# Patient Record
Sex: Male | Born: 1990 | Race: Black or African American | Hispanic: No | Marital: Single | Smoking: Current some day smoker
Health system: Southern US, Community
[De-identification: ages and names within clinical notes are randomized; demographics above are authoritative.]

## PROBLEM LIST (undated history)

## (undated) DIAGNOSIS — F329 Major depressive disorder, single episode, unspecified: Secondary | ICD-10-CM

## (undated) DIAGNOSIS — J45909 Unspecified asthma, uncomplicated: Secondary | ICD-10-CM

## (undated) DIAGNOSIS — F32A Depression, unspecified: Secondary | ICD-10-CM

## (undated) DIAGNOSIS — F419 Anxiety disorder, unspecified: Secondary | ICD-10-CM

## (undated) HISTORY — DX: Anxiety disorder, unspecified: F41.9

## (undated) HISTORY — DX: Depression, unspecified: F32.A

## (undated) HISTORY — DX: Unspecified asthma, uncomplicated: J45.909

## (undated) HISTORY — DX: Major depressive disorder, single episode, unspecified: F32.9

---
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
## 2014-10-03 ENCOUNTER — Ambulatory Visit (INDEPENDENT_AMBULATORY_CARE_PROVIDER_SITE_OTHER): Payer: BLUE CROSS/BLUE SHIELD

## 2014-10-03 ENCOUNTER — Ambulatory Visit (INDEPENDENT_AMBULATORY_CARE_PROVIDER_SITE_OTHER): Payer: BLUE CROSS/BLUE SHIELD | Admitting: Family Medicine

## 2014-10-03 VITALS — BP 110/72 | HR 70 | Temp 98.1°F | Resp 16 | Ht 71.5 in | Wt 184.2 lb

## 2014-10-03 DIAGNOSIS — Z0000 Encounter for general adult medical examination without abnormal findings: Secondary | ICD-10-CM

## 2014-10-03 DIAGNOSIS — R9431 Abnormal electrocardiogram [ECG] [EKG]: Secondary | ICD-10-CM | POA: Diagnosis not present

## 2014-10-03 DIAGNOSIS — M20031 Swan-neck deformity of right finger(s): Secondary | ICD-10-CM

## 2014-10-03 DIAGNOSIS — R079 Chest pain, unspecified: Secondary | ICD-10-CM

## 2014-10-03 LAB — POCT CBC
Granulocyte percent: 48.3 %G (ref 37–80)
HCT, POC: 43 % — AB (ref 43.5–53.7)
Hemoglobin: 13.9 g/dL — AB (ref 14.1–18.1)
Lymph, poc: 2.2 (ref 0.6–3.4)
MCH, POC: 28.9 pg (ref 27–31.2)
MCHC: 32.4 g/dL (ref 31.8–35.4)
MCV: 89 fL (ref 80–97)
MID (cbc): 0.5 (ref 0–0.9)
MPV: 6.4 fL (ref 0–99.8)
POC Granulocyte: 2.5 (ref 2–6.9)
POC LYMPH PERCENT: 42.4 %L (ref 10–50)
POC MID %: 9.3 %M (ref 0–12)
Platelet Count, POC: 256 10*3/uL (ref 142–424)
RBC: 4.83 M/uL (ref 4.69–6.13)
RDW, POC: 13 %
WBC: 5.2 10*3/uL (ref 4.6–10.2)

## 2014-10-03 LAB — POCT URINALYSIS DIPSTICK
Bilirubin, UA: NEGATIVE
Blood, UA: NEGATIVE
Glucose, UA: NEGATIVE
Ketones, UA: NEGATIVE
Leukocytes, UA: NEGATIVE
Nitrite, UA: NEGATIVE
Protein, UA: NEGATIVE
Spec Grav, UA: 1.015
Urobilinogen, UA: 0.2
pH, UA: 7.5

## 2014-10-03 LAB — LIPID PANEL
Cholesterol: 128 mg/dL (ref 0–200)
HDL: 48 mg/dL (ref >=40)
LDL Cholesterol: 73 mg/dL (ref 0–99)
Total CHOL/HDL Ratio: 2.7 Ratio
Triglycerides: 34 mg/dL (ref <150)
VLDL: 7 mg/dL (ref 0–40)

## 2014-10-03 LAB — COMPLETE METABOLIC PANEL WITH GFR
ALT: 21 U/L (ref 0–53)
AST: 27 U/L (ref 0–37)
Albumin: 4.1 g/dL (ref 3.5–5.2)
Alkaline Phosphatase: 74 U/L (ref 39–117)
BUN: 9 mg/dL (ref 6–23)
CO2: 25 mEq/L (ref 19–32)
Calcium: 9.3 mg/dL (ref 8.4–10.5)
Chloride: 105 mEq/L (ref 96–112)
Creat: 0.96 mg/dL (ref 0.50–1.35)
GFR, Est African American: >89 mL/min
GFR, Est Non African American: >89 mL/min
Glucose, Bld: 83 mg/dL (ref 70–99)
Potassium: 4.2 mEq/L (ref 3.5–5.3)
Sodium: 137 mEq/L (ref 135–145)
Total Bilirubin: 0.7 mg/dL (ref 0.2–1.2)
Total Protein: 7.1 g/dL (ref 6.0–8.3)

## 2014-10-03 NOTE — Patient Instructions (Signed)
[employer], [place], [place]  (see "Waiting for Superman" movie) [place], [place] [address] [phone] [email or web address]   Health Maintenance - 10-9 Years [place] After high school, you may attend college or technical or vocational school, enroll in the [employer], or enter the workforce. PHYSICAL, SOCIAL, AND EMOTIONAL DEVELOPMENT  One hour of regular physical activity daily is recommended. Continue to participate in sports.  Develop your own interests and consider community service or volunteerism.  Make decisions about college and work plans.  Throughout these years, you should assume responsibility for your own health care. Increasing independence is important for you.  You may be exploring your sexual identity. Understand that you should never be in a situation that makes you feel uncomfortable, and tell your partner if you do not want to engage in sexual activity.  Body image may become important to you. Be mindful that eating disorders can develop at this time. Talk to your parents or other caregivers if you have concerns about body image, weight gain, or losing weight.  You may notice mood disturbances, depression, anxiety, attention problems, or trouble with alcohol. Talk to your health care provider if you have concerns about mental illness.  Set limits for yourself and talk with your parents or other caregivers about independent decision making.  Handle conflict without physical violence.  Avoid loud noises which may impair hearing.  Limit television and computer time to 2 hours each day. Individuals who engage in excessive inactivity are more likely to become overweight. RECOMMENDED IMMUNIZATIONS  Influenza vaccine.  All adults should be immunized every year.  All adults, including pregnant women and people with hives-only allergy to eggs, can receive the inactivated influenza (IIV) vaccine.  Adults aged 18-49 years can receive the  recombinant influenza (RIV) vaccine. The RIV vaccine does not contain any egg protein.  Tetanus, diphtheria, and acellular pertussis (Td, Tdap) vaccine.  Pregnant women should receive 1 dose of Tdap vaccine during each pregnancy. The dose should be obtained regardless of the length of time since the last dose. Immunization is preferred during the 27th to 36th week of gestation.  An adult who has not previously received Tdap or who does not know his or her vaccine status should receive 1 dose of Tdap. This initial dose should be followed by tetanus and diphtheria toxoids (Td) booster doses every 10 years.  Adults with an unknown or incomplete history of completing a 3-dose immunization series with Td-containing vaccines should begin or complete a primary immunization series including a Tdap dose.  Adults should receive a Td booster every 10 years.  Varicella vaccine.  An adult without evidence of immunity to varicella should receive 2 doses or a second dose if he or she has previously received 1 dose.  Pregnant females who do not have evidence of immunity should receive the first dose after pregnancy. This first dose should be obtained before leaving the health care facility. The second dose should be obtained 4-8 weeks after the first dose.  Human papillomavirus (HPV) vaccine.  Females aged 13-26 years who have not received the vaccine previously should obtain the 3-dose series.  The vaccine is not recommended for pregnant females. However, pregnancy testing is not needed before receiving a dose. If a male is found to be pregnant after receiving a dose, no treatment is needed. In that case, the remaining doses should be delayed until after the pregnancy.  Males aged 42-21 years who have not received the vaccine previously should receive  the 3-dose series. Males aged 22-26 years may be immunized.  Immunization is recommended through the age of 65 years for any male who has sex with males  and did not get any or all doses earlier.  Immunization is recommended for any person with an immunocompromised condition through the age of 66 years if he or she did not get any or all doses earlier.  During the 3-dose series, the second dose should be obtained 4-8 weeks after the first dose. The third dose should be obtained 24 weeks after the first dose and 16 weeks after the second dose.  Measles, mumps, and rubella (MMR) vaccine.  Adults born in [birth year] or later should have 1 or more doses of MMR vaccine unless there is a contraindication to the vaccine or there is laboratory evidence of immunity to each of the three diseases.  A routine second dose of MMR vaccine should be obtained at least 28 days after the first dose for students attending postsecondary schools, health care workers, and international travelers.  For females of childbearing age, rubella immunity should be determined. If there is no evidence of immunity, females who are not pregnant should be vaccinated. If there is no evidence of immunity, females who are pregnant should delay immunization until after pregnancy.  Pneumococcal 13-valent conjugate (PCV13) vaccine.  When indicated, a person who is uncertain of his or her immunization history and has no record of immunization should receive the PCV13 vaccine.  An adult aged 4 years or older who has certain medical conditions and has not been previously immunized should receive 1 dose of PCV13 vaccine. This PCV13 should be followed with a dose of pneumococcal polysaccharide (PPSV23) vaccine. The PPSV23 vaccine dose should be obtained at least 8 weeks after the dose of PCV13 vaccine.  An adult aged 26 years or older who has certain medical conditions and previously received 1 or more doses of PPSV23 vaccine should receive 1 dose of PCV13. The PCV13 vaccine dose should be obtained 1 or more years after the last PPSV23 vaccine dose.  Pneumococcal polysaccharide (PPSV23)  vaccine.  When PCV13 is also indicated, PCV13 should be obtained first.  An adult younger than age 53 years who has certain medical conditions should be immunized.  Any person who resides in a long-term care facility should be immunized.  An adult smoker should be immunized.  People with an immunocompromised condition and certain other conditions should receive both PCV13 and PPSV23 vaccines.  People with human immunodeficiency virus (HIV) infection should be immunized as soon as possible after diagnosis.  Immunization during chemotherapy or radiation therapy should be avoided.  Routine use of PPSV23 vaccine is not recommended for American Indians, [place] Natives, or people younger than 65 years unless there are medical conditions that require PPSV23 vaccine.  When indicated, people who have unknown immunization and have no record of immunization should receive PPSV23 vaccine.  One-time revaccination 5 years after the first dose of PPSV23 is recommended for people aged 19-64 years who have chronic kidney failure, nephrotic syndrome, asplenia, or immunocompromised conditions.  Meningococcal vaccine.  Adults with asplenia or persistent complement component deficiencies should receive 2 doses of quadrivalent meningococcal conjugate (MenACWY-D) vaccine. The doses should be obtained at least 2 months apart.  Microbiologists working with certain meningococcal bacteria, [place] recruits, people at risk during an outbreak, and people who travel to or live in countries with a high rate of meningitis should be immunized.  A first-year college student up through age 71  years who is living in a residence hall should receive a dose if he or she did not receive a dose on or after his or her 63th birthday.  Adults who have certain high-risk conditions should receive one or more doses of vaccine.  Hepatitis A vaccine.  Adults who wish to be protected from this disease, have certain high-risk  conditions, work with hepatitis A-infected animals, work in hepatitis A research labs, or travel to or work in countries with a high rate of hepatitis A should be immunized.  Adults who were previously unvaccinated and who anticipate close contact with an international adoptee during the first 60 days after arrival in the [place] from a country with a high rate of hepatitis A should be immunized.  Hepatitis B vaccine.  Adults who wish to be protected from this disease, have certain high-risk conditions, may be exposed to blood or other infectious body fluids, are household contacts or sex partners of hepatitis B positive people, are clients or workers in certain care facilities, or travel to or work in countries with a high rate of hepatitis B should be immunized.  Haemophilus influenzae type b (Hib) vaccine.  A previously unvaccinated person with asplenia or sickle cell disease or having a scheduled splenectomy should receive 1 dose of Hib vaccine.  Regardless of previous immunization, a recipient of a hematopoietic stem cell transplant should receive a 3-dose series 6-12 months after his or her successful transplant.  Hib vaccine is not recommended for adults with HIV infection. TESTING  Annual screening for vision and hearing problems is recommended. Vision should be screened at least once between 81-37 years of age.  You may be screened for anemia or tuberculosis.  You should have a blood test to check for high cholesterol.  You should be screened for alcohol and drug use.  If you are sexually active, you may be screened for sexually transmitted infections (STIs), pregnancy, or HIV. You should be screened for STIs if:  Your sexual activity has changed since the last screening test, and you are at an increased risk for chlamydia or gonorrhea. Ask your health care provider if you are at risk.  If you are at an increased risk for hepatitis B, you should be screened for this virus.  You are considered at high risk for hepatitis B if you:  Were born in a country where hepatitis B occurs often. Talk with your health care provider about which countries are considered high risk.  Have parents who were born in a high-risk country and have not received a shot to protect against hepatitis B (hepatitis B vaccine).  Have HIV or AIDS.  Use needles to inject street drugs.  Live with or have sex with someone who has hepatitis B.  Are a man who has sex with other men (MSM).  Get hemodialysis treatment.  Take certain medicines for conditions like cancer, organ transplantation, or autoimmune conditions. NUTRITION   You should:  Have three servings of low-fat milk and dairy products daily. If you do not drink milk or consume dairy products, you should eat calcium-enriched foods, such as juice, bread, or cereal. Dark, leafy greens or canned fish are alternate sources of calcium.  Drink plenty of water. Fruit juice should be limited to 8-12 oz (240-360 mL) each day. Sugary beverages and sodas should be avoided.  Avoid eating foods high in fat, salt, or sugar, such as chips, candy, and cookies.  Avoid fast foods and limit eating out at restaurants.  Try not to skip meals, especially breakfast. You should eat a variety of vegetables, fruits, and lean meats.  Eat meals together as a family whenever possible. ORAL HEALTH Brush your teeth twice a day and floss at least once a day. You should have two dental exams a year.  SKIN CARE You should wear sunscreen when out in the sun. TALK TO SOMEONE ABOUT:  Precautions against pregnancy, contraception, and sexually transmitted infections.  Taking a prescription medicine daily to prevent HIV infection if you are at risk of being infected with HIV. This is called preexposure prophylaxis (PrEP). You are at risk if you:  Are a male who has sex with other males (MSM).  Are heterosexual and sexually active with more than one  partner.  Take drugs by injection.  Are sexually active with a partner who has HIV.  Whether you are at high risk of being infected with HIV. If you choose to begin PrEP, you should first be tested for HIV. You should then be tested every 3 months for as long as you are taking PrEP.  Drug, tobacco, and alcohol use among your friends or at friends' homes. Smoking tobacco or marijuana and taking drugs have health consequences and may impact your brain development.  Appropriate use of over-the-counter or prescription medicines.  Driving guidelines and riding with friends.  The risks of drinking and driving or boating. Call someone if you have been drinking or using drugs and need a ride. WHAT'S NEXT? Visit your pediatrician or family physician once a year. By young adulthood, you should transition from your pediatrician to a family physician or internal medicine specialist. If you are a male and are sexually active, you may want to begin annual physical exams with a gynecologist. Document Released: 07/21/2006 Document Revised: 04/30/2013 Document Reviewed: 08/10/2006 [facility] Patient Information 2015 [place], [place]. This information is not intended to replace advice given to you by your health care provider. Make sure you discuss any questions you have with your health care provider.

## 2014-10-03 NOTE — Progress Notes (Signed)
Subjective:  This chart was scribed for Elvina Sidle MD, by Veverly Fells, at [facility].  This patient was seen in room 12 and the patient's care was started at 1:40 PM.   Chief Complaint  Patient presents with   Annual Exam    like to discuss acid reflux, and chest pain     Patient ID: Raymond Moyer, male    DOB: [birth year], 25 y.o.   MRN: [id number]  HPI  HPI Comments: Raymond Moyer is a 24 y.o. male who presents to the [facility] complaining of chest pain intermittently a while ago/ is here for a check up.  He states that when he had chest pain a while ago, he was not eating well and thinks it may have happened to due his diet. Patient received an EKG while in college and was told that he had an "abnormal" EKG which was common among black african americans so they told him it was normal in his case. He also complains of jamming his right injdex finger playing basketball three days ago and states that his PIP joint feels swollen and tender. Patient finished his degree for exercise sports science and is now looking for an internship with the [employer]/ other jobs.  He was recently checked for STD's and found to be negative. He has no other complaints today.   Past Medical History  Diagnosis Date   Anxiety    Asthma    Depression     No current outpatient prescriptions on file prior to visit.   No current facility-administered medications on file prior to visit.    No Known Allergies   Review of Systems     Objective:   Physical Exam  Constitutional: He is oriented to person, place, and time. He appears well-developed and well-nourished. No distress.  HENT:  Head: Normocephalic and atraumatic.  Eyes: Conjunctivae and EOM are normal. Pupils are equal, round, and reactive to light.  Cardiovascular: Normal rate.   Pulmonary/Chest: Effort normal. No respiratory distress. He has no wheezes. He has no rales.    Abdominal: Soft. There is no tenderness. There is no rebound and no guarding.  Musculoskeletal:  The right PIP joint is swollen and tender.   Neurological: He is alert and oriented to person, place, and time.  Skin: Skin is warm and dry.  Psychiatric: He has a normal mood and affect. His behavior is normal.  Nursing note and vitals reviewed.  Filed Vitals:   10/03/14 1319  BP: 110/72  Pulse: 70  Temp: 98.1 F (36.7 C)  TempSrc: Oral  Resp: 16  Height: 5' 11.5" (1.816 m)  Weight: 184 lb 3.2 oz (83.553 kg)  SpO2: 99%   [facility] reading (PRIMARY) by  Dr. Milus Glazier:  No fracture.  Results for orders placed or performed in visit on 10/03/14  POCT CBC  Result Value Ref Range   WBC 5.2 4.6 - 10.2 K/uL   Lymph, poc 2.2 0.6 - 3.4   POC LYMPH PERCENT 42.4 10 - 50 %L   MID (cbc) 0.5 0 - 0.9   POC MID % 9.3 0 - 12 %M   POC Granulocyte 2.5 2 - 6.9   Granulocyte percent 48.3 37 - 80 %G   RBC 4.83 4.69 - 6.13 M/uL   Hemoglobin 13.9 (A) 14.1 - 18.1 g/dL   HCT, POC 28.4 (A) 13.2 - 53.7 %   MCV 89.0 80 - 97 fL   MCH, POC 28.9  27 - 31.2 pg   MCHC 32.4 31.8 - 35.4 g/dL   RDW, POC 28.413.0 %   Platelet Count, POC 256 142 - 424 K/uL   MPV 6.4 0 - 99.8 fL  POCT urinalysis dipstick  Result Value Ref Range   Color, UA yellow    Clarity, UA clear    Glucose, UA neg    Bilirubin, UA neg    Ketones, UA neg    Spec Grav, UA 1.015    Blood, UA neg    pH, UA 7.5    Protein, UA neg    Urobilinogen, UA 0.2    Nitrite, UA neg    Leukocytes, UA Negative        Assessment & Plan:    This chart was scribed in my presence and reviewed by me personally.    ICD-9-CM ICD-10-CM   1. Annual physical exam V70.0 Z00.00 POCT CBC     POCT urinalysis dipstick     COMPLETE METABOLIC PANEL WITH GFR     Lipid panel  2. Nonspecific abnormal electrocardiogram (ECG) (EKG) 794.31 R94.31 EKG 12-Lead     EKG 12-Lead  3. 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

## 2015-08-05 ENCOUNTER — Ambulatory Visit (INDEPENDENT_AMBULATORY_CARE_PROVIDER_SITE_OTHER): Payer: Self-pay

## 2015-08-05 ENCOUNTER — Ambulatory Visit (INDEPENDENT_AMBULATORY_CARE_PROVIDER_SITE_OTHER): Payer: Self-pay | Admitting: Family Medicine

## 2015-08-05 VITALS — BP 130/82 | HR 68 | Temp 97.7°F | Resp 18 | Ht 72.37 in | Wt 188.0 lb

## 2015-08-05 DIAGNOSIS — S8991XA Unspecified injury of right lower leg, initial encounter: Secondary | ICD-10-CM

## 2015-08-05 MED ORDER — IBUPROFEN 200 MG PO TABS
200.0000 mg | ORAL_TABLET | Freq: Once | ORAL | Status: AC
  Administered 2015-08-05: 200 mg via ORAL

## 2015-08-05 NOTE — Patient Instructions (Addendum)
Ibuprofen 2-3 tablets every 8 hours for pain and swelling Elevate when possible Ice for 20 minutes off and on today Gentle range of motion several times a day Leave steri strips on until they fall off    IF you received an x-ray today, you will receive an invoice from [facility]. Please contact [facility] at [phone] with questions or concerns regarding your invoice.   IF you received labwork today, you will receive an invoice from [facility]. Please contact [facility] at [phone] with questions or concerns regarding your invoice.   Our billing staff will not be able to assist you with questions regarding bills from these companies.  You will be contacted with the lab results as soon as they are available. The fastest way to get your results is to activate your My Chart account. Instructions are located on the last page of this paperwork. If you have not heard from us regarding the results in 2 weeks, please contact this office.

## 2015-08-05 NOTE — Progress Notes (Signed)
   Subjective:    Patient ID: Raymond Moyer, male    DOB: [birth year], 25 y.o.   MRN: [id number]  HPI This is a pleasant 25 yo male who is accompanied by his mother. He fell on his right knee yesterday evening, going up a stair. He put ice on it and went to bed. He was able to sleep last night. When he awoke this morning, it was very stiff and hard to stand on. Pain with bending. No previous injury/pain to knee. Has been wearing a brace and using crutches (belonging to a friend) with some improvement in stability and pain.  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 He works 8 hour shifts, on his feet entire shift.    Past Medical History  Diagnosis Date  . Anxiety   . Asthma   . Depression   History reviewed. No pertinent past surgical history. History reviewed. No pertinent family history. Social History  Substance Use Topics  . Smoking status: Never Smoker   . Smokeless tobacco: None  . Alcohol Use: 0.0 oz/week    0 Standard drinks or equivalent per week     Review of Systems + pain, - weakness, - numbness, - tingling, + bleeding    Objective:   Physical Exam  Constitutional: He is oriented to person, place, and time. He appears well-developed and well-nourished. No distress.  HENT:  Head: Normocephalic and atraumatic.  Eyes: Conjunctivae are normal.  Neck: Normal range of motion. Neck supple.  Cardiovascular: Normal rate.   Pulmonary/Chest: Effort normal.  Musculoskeletal:  Able to slowly flex and extend knee. Mild swelling of patella, pain with extension over patella. TTP of patella. No medial/lateral joint line tenderness. No effusion. No laxity. 3 cm long, shallow laceration on knee- cleaned and steri strips applied.   Neurological: He is alert and oriented to person, place, and time.  Skin: Skin is warm and dry. He is not diaphoretic.  Psychiatric: He has a normal mood and affect. His behavior is normal. Judgment and thought content normal.  Vitals  reviewed.  BP 130/82 mmHg  Pulse 68  Temp(Src) 97.7 F (36.5 C) (Oral)  Resp 18  Ht 6' 0.37" (1.838 m)  Wt 188 lb (85.276 kg)  BMI 25.24 kg/m2  SpO2 98% Wt Readings from Last 3 Encounters:  08/05/15 188 lb (85.276 kg)  10/03/14 184 lb 3.2 oz (83.553 kg)   Ibuprofen 600 mg given and ice  Dg Knee Complete 4 Views Right  08/05/2015  CLINICAL DATA:  Fall.  Knee pain. EXAM: RIGHT KNEE - COMPLETE 4+ VIEW COMPARISON:  No prior. FINDINGS: No acute bony or joint abnormality identified. No evidence of fracture or dislocation. IMPRESSION: No acute or focal abnormality. Electronically Signed   By: Maisie Fushomas  Register   On: 08/05/2015 11:31       Assessment & Plan:  Discussed with Dr. Clelia CroftShaw 1. Knee injury, right, initial encounter - DG Knee Complete 4 Views Right; Future - ibuprofen (ADVIL,MOTRIN) tablet 200 mg; Take 1 tablet (200 mg total) by mouth once. - reviewed Xray results and provided written and verbal instructions for NSAID, ice, gentle ROM - RTC prn  Olean Reeeborah Gessner, FNP-BC  [facility], [facility]  08/05/2015 12:10 PM

## 2015-08-06 NOTE — Progress Notes (Signed)
Patient ID: Raymond Moyer, male   DOB: Apr 10, 1991, 25 y.o.   MRN: [id number] Reviewed documentation and agree w/ assessment and plan. Norberto SorensonEva Shaw, MD MPH

## 2017-01-06 IMAGING — CR DG KNEE COMPLETE 4+V*R*
4 series · 4 of 4 positions shown · non-contrast
Comparison: No prior.

CLINICAL DATA: Fall.  Knee pain.

EXAM:
RIGHT KNEE - COMPLETE 4+ VIEW

[AP]
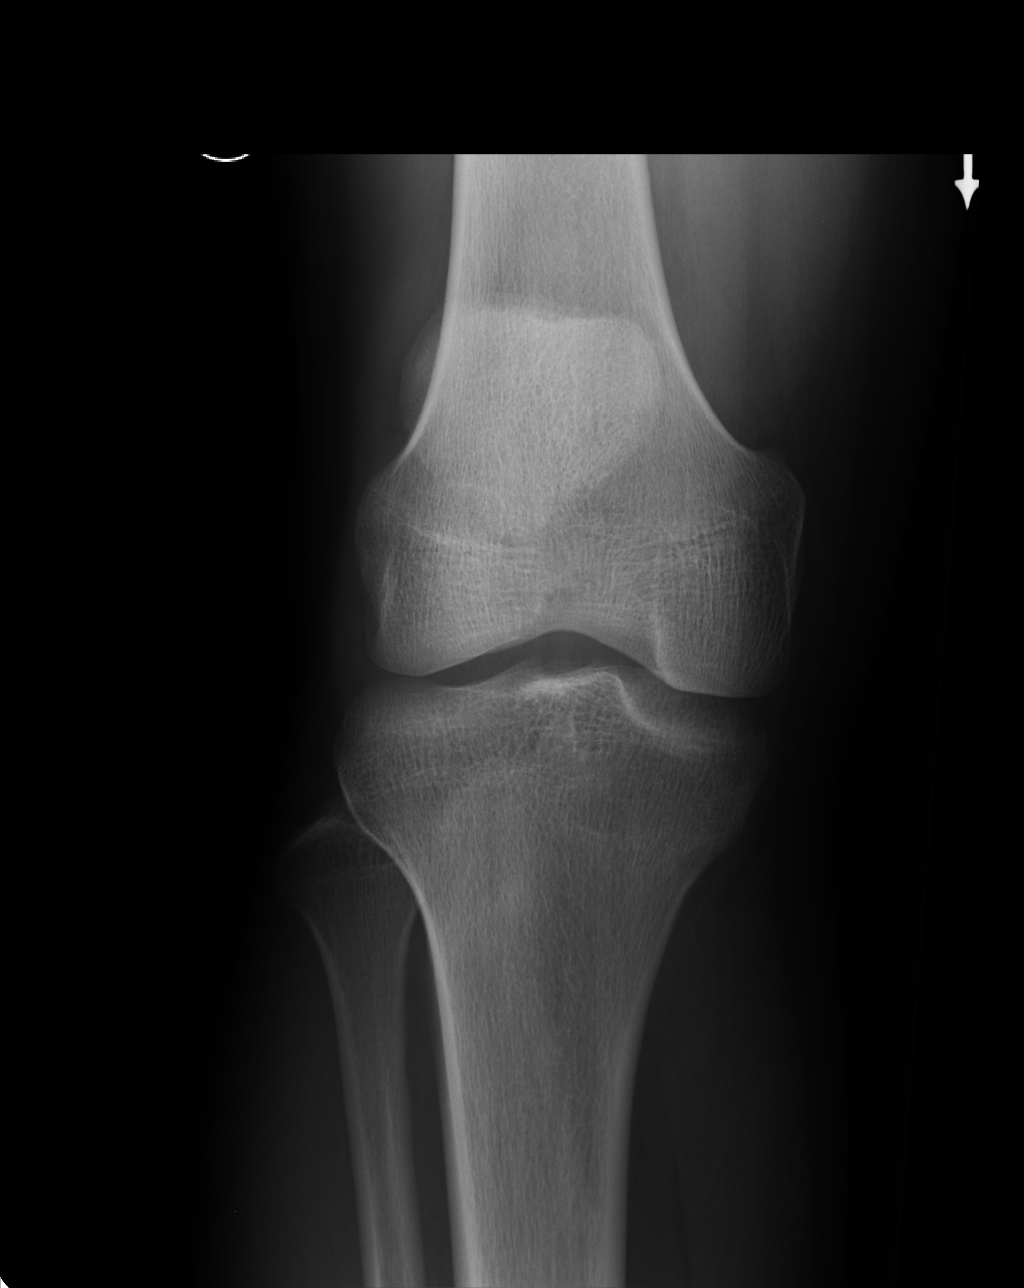

[ap axial]
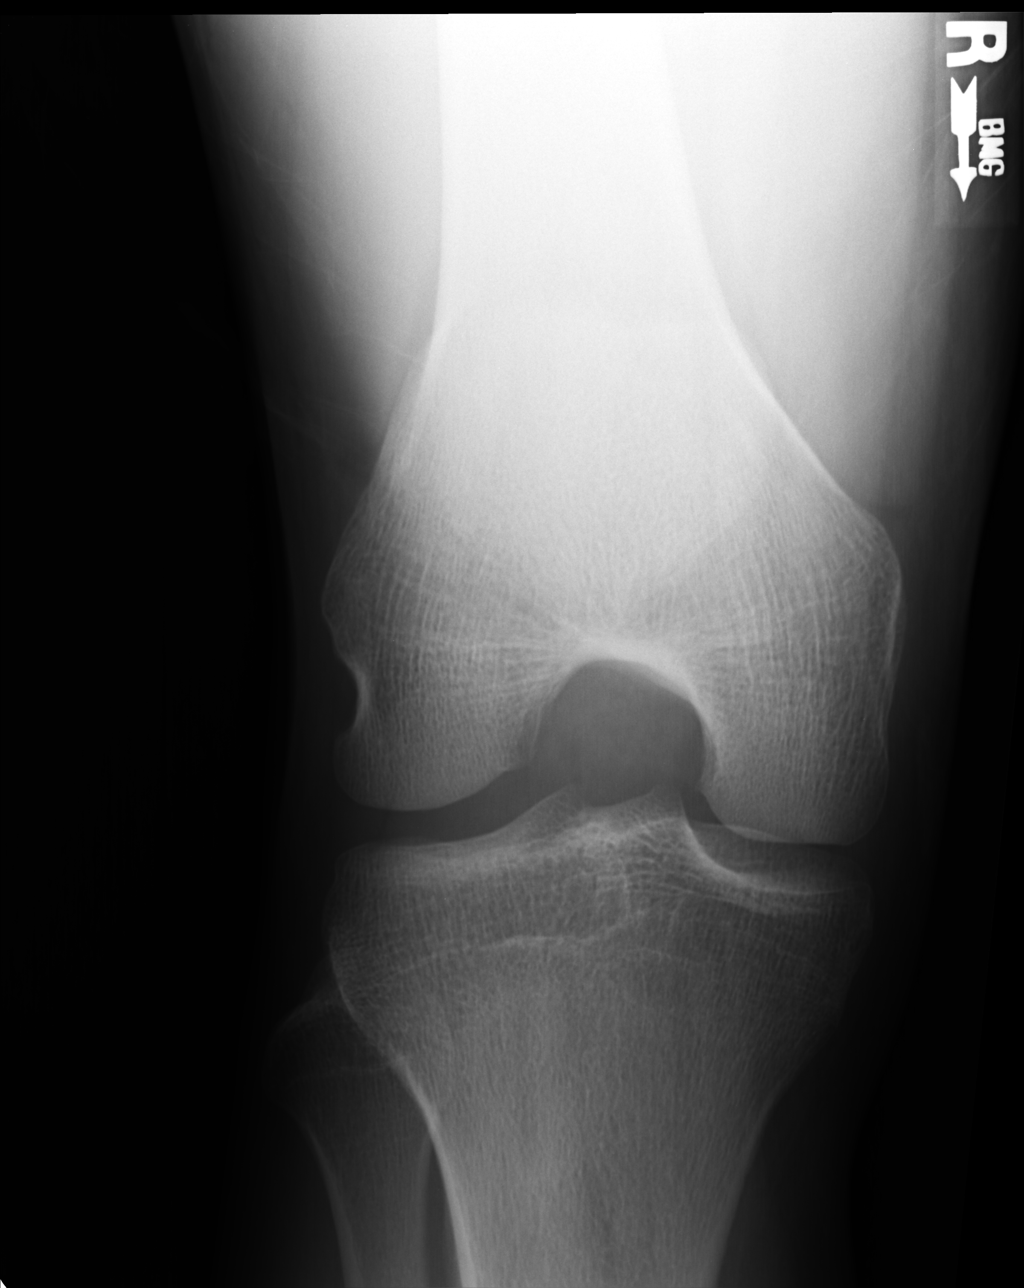

[lateral]
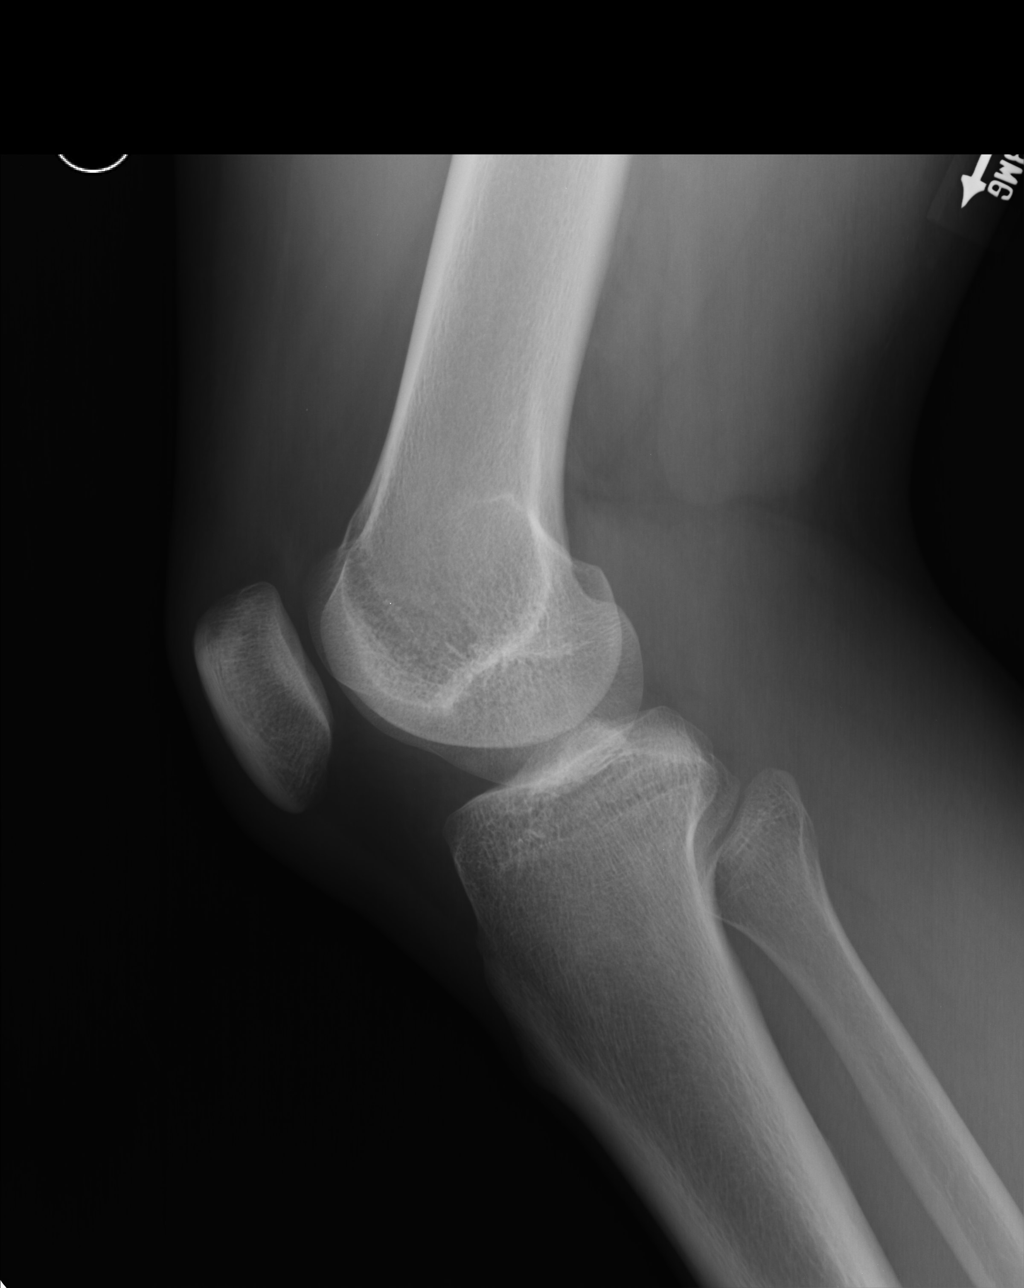

[sunrise]
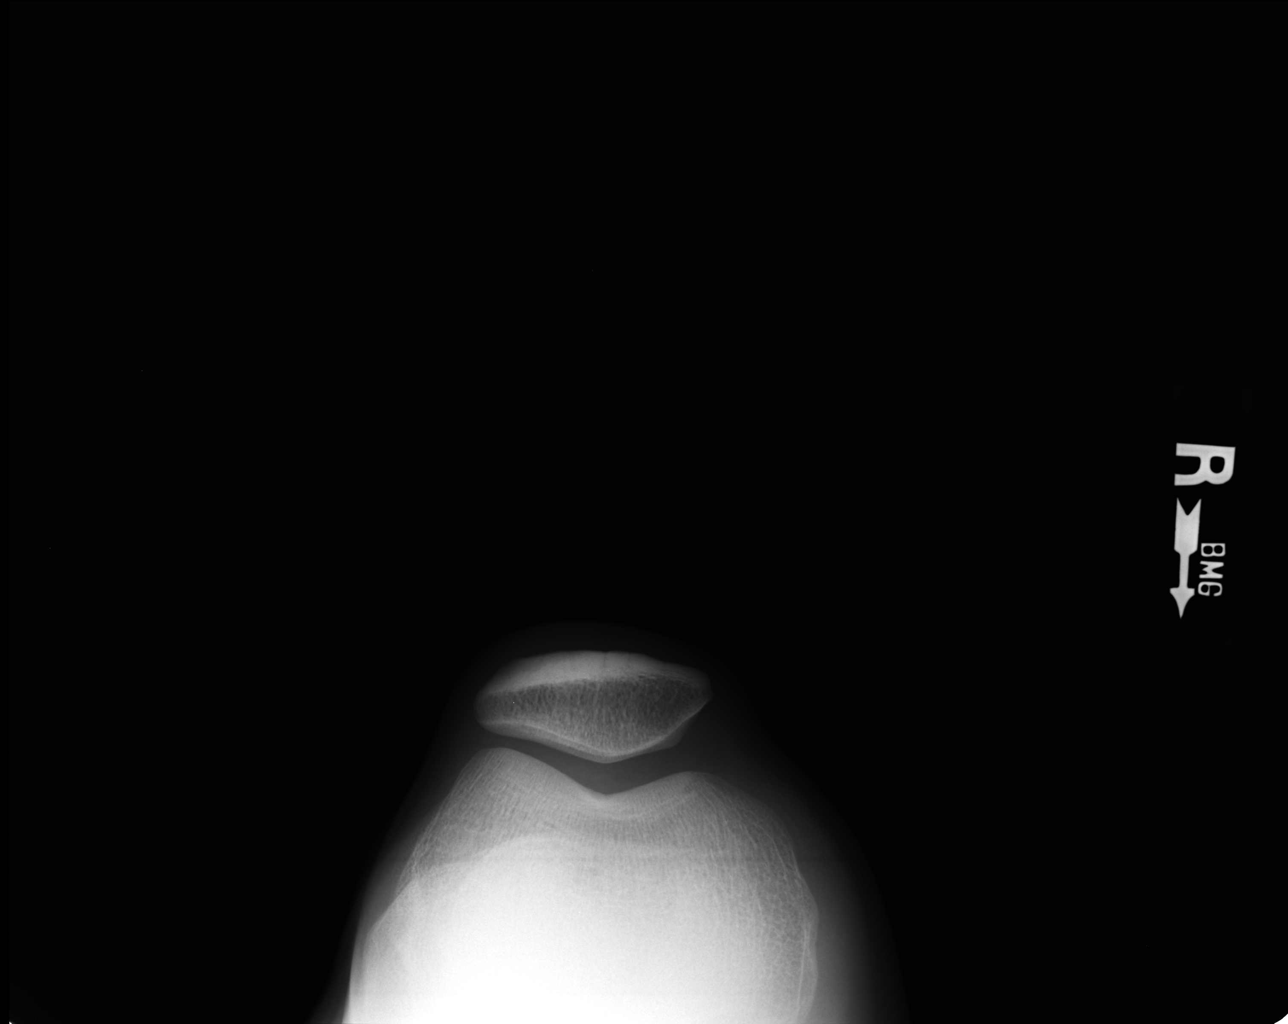

[4 of 4 positions shown; findings below may reference images not displayed]

FINDINGS: No acute bony or joint abnormality identified. No evidence of
fracture or dislocation.
IMPRESSION: No acute or focal abnormality.

## 2018-12-28 ENCOUNTER — Other Ambulatory Visit: Payer: Self-pay

## 2018-12-28 ENCOUNTER — Ambulatory Visit: Payer: Self-pay | Admitting: Physician Assistant

## 2018-12-28 DIAGNOSIS — Z113 Encounter for screening for infections with a predominantly sexual mode of transmission: Secondary | ICD-10-CM

## 2018-12-29 ENCOUNTER — Encounter: Payer: Self-pay | Admitting: Physician Assistant

## 2018-12-29 NOTE — Progress Notes (Signed)
    STI clinic/screening visit  Subjective:  Raymond Moyer is a 28 y.o. male being seen today for an STI screening visit. The patient reports they do not have symptoms.  Patient has the following medical conditions:  There are no active problems to display for this patient.    Chief Complaint  Patient presents with  . SEXUALLY TRANSMITTED DISEASE    HPI  Patient reports that he is without symptoms.  Requests screening today.   See flowsheet for further details and programmatic requirements.    The following portions of the patient's history were reviewed and updated as appropriate: allergies, current medications, past medical history, past social history, past surgical history and problem list.  Objective:  There were no vitals filed for this visit.  Physical Exam Constitutional:      General: He is not in acute distress.    Appearance: Normal appearance.  HENT:     Head: Normocephalic and atraumatic.     Mouth/Throat:     Mouth: Mucous membranes are moist.     Pharynx: Oropharynx is clear. No oropharyngeal exudate or posterior oropharyngeal erythema.  Eyes:     Conjunctiva/sclera: Conjunctivae normal.  Neck:     Musculoskeletal: Neck supple.  Pulmonary:     Effort: Pulmonary effort is normal.  Abdominal:     Palpations: Abdomen is soft. There is no mass.     Tenderness: There is no abdominal tenderness. There is no guarding or rebound.  Genitourinary:    Penis: Normal.      Scrotum/Testes: Normal.     Comments: Pubic area without nits, lice, edema, erythema, lesions and inguinal adenopathy. Penis circumcised and without any discharge at the meatus. Lymphadenopathy:     Cervical: No cervical adenopathy.  Skin:    General: Skin is warm and dry.     Findings: No bruising, erythema, lesion or rash.  Neurological:     Mental Status: He is alert and oriented to person, place, and time.  Psychiatric:        Mood and Affect: Mood normal.        Behavior: Behavior  normal.        Thought Content: Thought content normal.        Judgment: Judgment normal.       Assessment and Plan:  [name] is a 28 y.o. male presenting to the [facility] Department for STI screening  1. Screening for STD (sexually transmitted disease) Patient is without symptoms today. Rec condoms with all sex Await test results.  Counseled that RN will call if needs to RTC for any treatment once results are back.   - Gram stain - HIV [facility] - Syphilis Serology, [place] [facility] - Gonococcus culture     No follow-ups on file.  No future appointments.  Jerene Dilling, PA

## 2018-12-31 LAB — GRAM STAIN

## 2019-01-01 LAB — GONOCOCCUS CULTURE
